# Patient Record
Sex: Male | Born: 1960 | Race: White | Hispanic: No | Marital: Married | Smoking: Never smoker
Health system: Southern US, Community
[De-identification: ages and names within clinical notes are randomized; demographics above are authoritative.]

## PROBLEM LIST (undated history)

## (undated) DIAGNOSIS — E785 Hyperlipidemia, unspecified: Secondary | ICD-10-CM

## (undated) DIAGNOSIS — K5792 Diverticulitis of intestine, part unspecified, without perforation or abscess without bleeding: Secondary | ICD-10-CM

## (undated) HISTORY — DX: Diverticulitis of intestine, part unspecified, without perforation or abscess without bleeding: K57.92

## (undated) HISTORY — DX: Hyperlipidemia, unspecified: E78.5

## (undated) HISTORY — PX: KNEE ARTHROSCOPY: SUR90

## (undated) HISTORY — PX: APPENDECTOMY: SHX54

---

## 2003-02-07 ENCOUNTER — Encounter (INDEPENDENT_AMBULATORY_CARE_PROVIDER_SITE_OTHER): Payer: Self-pay | Admitting: *Deleted

## 2003-02-07 ENCOUNTER — Inpatient Hospital Stay (HOSPITAL_COMMUNITY): Admission: EM | Admit: 2003-02-07 | Discharge: 2003-02-19 | Payer: Self-pay | Admitting: Emergency Medicine

## 2003-02-10 ENCOUNTER — Encounter: Payer: Self-pay | Admitting: General Surgery

## 2003-02-11 ENCOUNTER — Encounter: Payer: Self-pay | Admitting: Surgery

## 2003-02-13 ENCOUNTER — Encounter: Payer: Self-pay | Admitting: General Surgery

## 2003-02-15 ENCOUNTER — Encounter: Payer: Self-pay | Admitting: General Surgery

## 2009-10-16 ENCOUNTER — Encounter: Admission: RE | Admit: 2009-10-16 | Discharge: 2009-10-16 | Payer: Self-pay | Admitting: Internal Medicine

## 2010-09-11 NOTE — Discharge Summary (Signed)
   NAMEMARCELLE, BEBOUT NO.:  [id number]   MEDICAL RECORD NO.:  [id number]                   PATIENT TYPE:  INP   LOCATION:  5734                                 FACILITY:  [facility]   PHYSICIAN:  Jimmye Norman III, M.D.               DATE OF BIRTH:  [birth year]   DATE OF ADMISSION:  02/06/2003  DATE OF DISCHARGE:  02/19/2003                                 DISCHARGE SUMMARY   DISCHARGE DIAGNOSES:  1. Acute appendicitis with rupture.  2. Postoperative ileus.   DISCHARGE MEDICATIONS:  1. Advil.  2. Multivitamins.  3. Tylenol.   DIET:  As tolerated.   ACTIVITY:  He was on a 15 pound weight lifting limit.   FOLLOWUP:  He was to follow up to see me in 7-10 days.   HOSPITAL COURSE:  The patient came in to the emergency room with abdominal  pain and an elevated white count and a CT scan subsequently showed acute  appendicitis with possible rupture.  He was taken to surgery at which time  he was found to have an acutely inflamed and ruptured appendix with an  appendolith over in the right lower quadrant.  He was treated with triple  antibiotics postoperatively and he was afebrile; however, he persisted with  a prolonged ileus which subsequently was investigated with a CT scan to rule  out an intraabdominal or pelvic abscess.  After seven days and no opening  up, CT scan failed to demonstrate an abscess and over the next two to three  days the patient opened up well, was tolerating diet well, and was  discharged home by one of my partners.  He was tolerating a diet well and  doing well.  He was to see me in seven days to two weeks.                                                Kathrin Ruddy, M.D.    JW/MEDQ  D:  02/27/2003  T:  02/28/2003  Job:  [id number]

## 2010-09-11 NOTE — H&P (Signed)
NAMEALROY, PORTELA NO.:  [id number]   MEDICAL RECORD NO.:  [id number]                   PATIENT TYPE:  EMS   LOCATION:  MAJO                                 FACILITY:  [facility]   PHYSICIAN:  Jimmye Norman III, M.D.               DATE OF BIRTH:  1960-10-01   DATE OF ADMISSION:  02/06/2003  DATE OF DISCHARGE:                                HISTORY & PHYSICAL   CHIEF COMPLAINT:  The patient is a 50 year old with acute appendicitis.   HISTORY OF PRESENT ILLNESS:  The patient came into [facility] Emergency Room  at approximately 10:38 p.m. At that time he had abdominal pain, which had  been present for one and one-half to two days. He had recently flown in from  a sales trip. Had some mild nausea, but abdominal pain, which was crampy in  nature and had now started to localize towards the right lower quadrant. He  had no vomiting, some mild nausea, and decreased appetite and came into the  emergency room for evaluation. He reports no previous episodes of this time  and came in for evaluation.   PAST MEDICAL HISTORY:  Unremarkable. He has no history of cardiac, renal,  pulmonary, or liver disease.   PAST SURGICAL HISTORY:  He has had a right knee arthroscopy 10 years ago and  no problems with anesthesia.   MEDICATIONS:  None.   ALLERGIES:  No known drug allergies.   REVIEW OF SYSTEMS:  Unremarkable.   PHYSICAL EXAMINATION:  VITAL SIGNS:  The patient came in afebrile, but  currently has a temperature of 103.3. His pulse is 116, blood pressure  normal.  HEENT:  He is normocephalic and atraumatic. He has no scleral icterus.  NECK:  Supple with no palpable masses. He has no bruits.  CHEST:  Clear to auscultation and percussion.  CARDIAC:  He has tachycardia, but a regular rhythm. No murmurs, gallops,  rubs, or heaves.  ABDOMEN:  Distended with hypoactive bowel sounds. No diffusely tender, but  he has severe tenderness with guarding and rebound in the  right lower  quadrant.  RECTAL:  No gross blood. He has no palpable masses in his pelvis.  GU/RECTAL:  He has no meatal blood and a normal positioned prostate on  rectum.  NEUROLOGIC:  Cranial nerves 2-12 grossly intact.  PSYCHIATRIC:  He is normal.   LABORATORY DATA:  He has a white count of 17,000 with a marked left shift.  He is slightly hemoconcentrated with a hemoglobin of 16. CT scan  demonstrates acute appendicitis.   IMPRESSION:  Acute appendicitis in an otherwise healthy 50 year old male  with delay in diagnosis to some degree because of the need for a CAT scan.  There is a chance that the patient may have perforated. I have explained the  risks and benefits of this procedure to the patient and he wishes to  proceed  accordingly. I have given him time to contact his family and we will go  ahead with an attempted laparoscopic appendectomy. I have told the patient  that it is likely that we may need to open him because of the severity of  the perforation and/or contamination.                                                Kathrin Ruddy, M.D.    JW/MEDQ  D:  02/07/2003  T:  02/07/2003  Job:  [id number]   cc:   Paula Libra, M.D.   Volney American, M.D.

## 2010-09-11 NOTE — Op Note (Signed)
NAMEAADYN, BUCHHEIT NO.:  [id number]   MEDICAL RECORD NO.:  [id number]                   PATIENT TYPE:  INP   LOCATION:  2550                                 FACILITY:  [facility]   PHYSICIAN:  Jimmye Norman III, M.D.               DATE OF BIRTH:  22-Mar-1961   DATE OF PROCEDURE:  02/07/2003  DATE OF DISCHARGE:                                 OPERATIVE REPORT   PREOPERATIVE DIAGNOSIS:  Acute appendicitis with possible rupture.   POSTOPERATIVE DIAGNOSIS:  Ruptured acute appendicitis.   PROCEDURE:  Laparoscopic appendectomy.   SURGEON:  Jimmye Norman, M.D.   ANESTHESIA:  General endotracheal anesthesia.   ESTIMATED BLOOD LOSS:  100 mL.   COMPLICATIONS:  Ruptured appendix.   CONDITION:  Fair.   FINDINGS:  The patient had a ruptured gangrenous appendix with a free  floating appendicolith in the right lower quadrant which was retrieved along  with the resected appendix.  There was no abscess cavity or walled off  abscess area.   INDICATIONS FOR PROCEDURE:  The patient is a 50 year old male otherwise  healthy who comes in with a fever, a white count of 17,000, right lower  quadrant and a CT demonstrating acute appendicitis.   PROCEDURE:  The patient was taken to the operating room and placed on the  table in the supine position. After an adequate general anesthetic was  administered he was prepped and draped in the usual sterile manner exposing  the midline and the right lower quadrant.   We made a supraumbilical incision with the patient in Trendelenburg  position.  We dissected down to the midline fascia through which a Veress  needle was passed into the peritoneal cavity and confirmed to be in position  with the saline test.  Once this was done, carbon dioxide insufflation was  instilled through the Veress into the peritoneal cavity, up to a maximum  intra-abdominal pressure of 15 mmHg.  Once this was done, a right upper  quadrant 5 mm cannula  and a suprapubic 11-12 cannula were passed into the  peritoneal cavity under direct vision.  Once this was done, the patient was  placed in a steeper Trendelenburg position and the left sided was tilted  down.   The acutely inflamed and gangrenous appendix was easily noted in the right  lower quadrant.  It was partially walled off by omentum and also small bowel  loops.  There was one C loop of terminal ileum which was over by the  appendix which had some dense peritoneal attachments which were left in  place.   A free floating appendicolith was noted in the right lower quadrant and it  was grasped with a rigid grasper and then brought out through the suprapubic  site using an EndoCatch bag.  We had to replace the cannula in that area  performing a second site of entrance into  the peritoneal cavity.  Once this  was done, we were able to dissect out the acutely inflamed and gangrenous  appendix down to its base.  We easily saw the base and had an adequate  window around it although it was thinned out and perforated above the area  of the base.  An Endo GIA with 3.5 mm staples was taken across the base of  the appendix and then subsequently a 2.5 mm staple Endo GIA passed across  the vascular pedicle.  There had been bleeding up until that time from the  vascular pedicle and we stapled and clamped below that.   Once we had that in place, we used an EndoCatch bag to bring it out through  the suprapubic site.  We irrigated with four liters of warm saline solution  and no further purulent debris as aspirated.  There was no other indication  of bleeding.  Once we had irrigated adequately we removed all cannulas and  subsequently closed.   The supraumbilical site was closed using a figure-of-eight stitch of 0  Vicryl passed on a UR6 needle.  1/4% Marcaine with epinephrine was injected  at all sites and the skin was closed using a running subcuticular stitch of  5-0 Vicryl.  Sterile dressings  were applied.  All needle counts, sponge  counts and instrument counts were correct.                                               Kathrin Ruddy, M.D.    JW/MEDQ  D:  02/07/2003  T:  02/07/2003  Job:  [id number]

## 2012-05-18 ENCOUNTER — Other Ambulatory Visit: Payer: Self-pay | Admitting: Gastroenterology

## 2012-05-18 DIAGNOSIS — Z1211 Encounter for screening for malignant neoplasm of colon: Secondary | ICD-10-CM

## 2012-06-19 ENCOUNTER — Ambulatory Visit
Admission: RE | Admit: 2012-06-19 | Discharge: 2012-06-19 | Disposition: A | Discharge status: Home / Self Care | Payer: BC Managed Care – PPO | Source: Ambulatory Visit | Attending: Gastroenterology | Admitting: Gastroenterology

## 2012-06-19 DIAGNOSIS — Z1211 Encounter for screening for malignant neoplasm of colon: Secondary | ICD-10-CM

## 2015-08-27 DIAGNOSIS — Z0000 Encounter for general adult medical examination without abnormal findings: Secondary | ICD-10-CM | POA: Diagnosis not present

## 2015-08-27 DIAGNOSIS — Z125 Encounter for screening for malignant neoplasm of prostate: Secondary | ICD-10-CM | POA: Diagnosis not present

## 2015-09-25 DIAGNOSIS — R799 Abnormal finding of blood chemistry, unspecified: Secondary | ICD-10-CM | POA: Diagnosis not present

## 2015-10-07 DIAGNOSIS — H524 Presbyopia: Secondary | ICD-10-CM | POA: Diagnosis not present

## 2015-10-07 DIAGNOSIS — H5203 Hypermetropia, bilateral: Secondary | ICD-10-CM | POA: Diagnosis not present

## 2016-01-27 DIAGNOSIS — E785 Hyperlipidemia, unspecified: Secondary | ICD-10-CM | POA: Diagnosis not present

## 2016-01-28 ENCOUNTER — Telehealth: Payer: Self-pay | Admitting: Cardiology

## 2016-01-28 NOTE — Telephone Encounter (Signed)
[facility] 02/27/16 with Dr Antoine PocheHochrein.  [facility] ([facility] for Dr Hochrein's schedule on 02/27/16. lp

## 2016-02-26 NOTE — Progress Notes (Signed)
Cardiology Office Note   Date:  02/27/2016   ID:  Cameron FortsFloyd Avallone, DOB Sep 03, 1960, MRN [id number]  PCP:  Katy ApoPOLITE,RONALD D, MD  Cardiologist:   Rollene RotundaJames Kareli Hossain, MD  Referring:  Katy ApoPOLITE,RONALD D, MD  Chief Complaint  Patient presents with  . Shortness of Breath      History of Present Illness: Cameron Rich is a 55 y.o. male who presents for risk assessment.  He has cardiovascular risk factors with borderline HTN and dyslipidemia.  He has had no past history.  He is active and rides a bike and exercises otherwise routinely.  The patient denies any new symptoms such as chest discomfort, neck or arm discomfort. There has been no new shortness of breath, PND or orthopnea. There have been no reported palpitations, presyncope or syncope.  He might get some dyspnea at peak exertion.    Past Medical History:  Diagnosis Date  . Diverticulitis   . Hyperlipidemia     Past Surgical History:  Procedure Laterality Date  . APPENDECTOMY    . KNEE ARTHROSCOPY Right      No current outpatient prescriptions on file.   No current facility-administered medications for this visit.     Allergies:   Review of patient's allergies indicates no known allergies.    Social History:  The patient  reports that he has never smoked. He has never used smokeless tobacco.   Family History:  The patient's family history includes Colon cancer in his mother; Diverticulosis in his brother; Hyperlipidemia in his father.    ROS:  Please see the history of present illness.   Otherwise, review of systems are positive for none.   All other systems are reviewed and negative.    PHYSICAL EXAM: VS:  BP 126/84   Pulse 75   Ht 6\' 1"  (1.854 m)   Wt 222 lb (100.7 kg)   BMI 29.29 kg/m  , BMI Body mass index is 29.29 kg/m. GENERAL:  Well appearing HEENT:  Pupils equal round and reactive, fundi not visualized, oral mucosa unremarkable NECK:  No jugular venous distention, waveform within normal limits, carotid upstroke  brisk and symmetric, no bruits, no thyromegaly LYMPHATICS:  No cervical, inguinal adenopathy LUNGS:  Clear to auscultation bilaterally BACK:  No CVA tenderness CHEST:  Unremarkable HEART:  PMI not displaced or sustained,S1 and S2 within normal limits, no S3, no S4, no clicks, no rubs, no murmurs ABD:  Flat, positive bowel sounds normal in frequency in pitch, no bruits, no rebound, no guarding, no midline pulsatile mass, no hepatomegaly, no splenomegaly EXT:  2 plus pulses throughout, no edema, no cyanosis no clubbing SKIN:  No rashes no nodules NEURO:  Cranial nerves II through XII grossly intact, motor grossly intact throughout PSYCH:  Cognitively intact, oriented to person place and time    EKG:  EKG is ordered today. The ekg ordered today demonstrates sinus rhythm, rate 75, axis within normal limits, intervals within normal limits, no acute ST-T wave changes.   Recent Labs: No results found for requested labs within last 8760 hours.    Lipid Panel No results found for: CHOL, TRIG, HDL, CHOLHDL, VLDL, LDLCALC, LDLDIRECT    Wt Readings from Last 3 Encounters:  02/27/16 222 lb (100.7 kg)      Other studies Reviewed: Additional studies/ records that were reviewed today include: None. Review of the above records demonstrates:  Please see elsewhere in the note.     ASSESSMENT AND PLAN:  SOB:  The patient has some mild  dyspnea with exertion and cardiovascular risk factors. However, think the pretest probability of obstructive coronary disease is low. I think it would be reasonable to order a coronary calcium score.  HTN:  Blood pressure is well controlled. Of the meds as listed.  DYSLIPIDEMIA:  Further goals of therapy will be based on presence or absence of coronary calcium   Current medicines are reviewed at length with the patient today.  The patient has no concerns regarding medicines.  The following changes have been made:  no change  Labs/ tests ordered today  include:   Orders Placed This Encounter  Procedures  . CT CARDIAC SCORING  . EKG 12-Lead     Disposition:   FU with me as needed.     Signed, Rollene RotundaJames Yukari Flax, MD  02/27/2016 11:17 AM    [facility]

## 2016-02-27 ENCOUNTER — Encounter: Payer: Self-pay | Admitting: Cardiology

## 2016-02-27 ENCOUNTER — Ambulatory Visit (INDEPENDENT_AMBULATORY_CARE_PROVIDER_SITE_OTHER): Payer: BLUE CROSS/BLUE SHIELD | Admitting: Cardiology

## 2016-02-27 VITALS — BP 126/84 | HR 75 | Ht 73.0 in | Wt 222.0 lb

## 2016-02-27 DIAGNOSIS — R0602 Shortness of breath: Secondary | ICD-10-CM | POA: Diagnosis not present

## 2016-02-27 NOTE — Patient Instructions (Signed)
Medication Instructions:  Continue current medications  Labwork: None Ordered  Testing/Procedures: Your physician has requested that you have a Coronary Calcium Score. This test is done at our [address] [facility]  Follow-Up: Your physician recommends that you schedule a follow-up appointment in: As Needed   Any Other Special Instructions Will Be Listed Below (If Applicable).   If you need a refill on your cardiac medications before your next appointment, please call your pharmacy.

## 2016-03-16 ENCOUNTER — Ambulatory Visit (INDEPENDENT_AMBULATORY_CARE_PROVIDER_SITE_OTHER)
Admission: RE | Admit: 2016-03-16 | Discharge: 2016-03-16 | Disposition: A | Discharge status: Home / Self Care | Payer: Self-pay | Source: Ambulatory Visit | Attending: Cardiology | Admitting: Cardiology

## 2016-03-16 DIAGNOSIS — R0602 Shortness of breath: Secondary | ICD-10-CM

## 2016-05-14 DIAGNOSIS — R509 Fever, unspecified: Secondary | ICD-10-CM | POA: Diagnosis not present

## 2016-05-14 DIAGNOSIS — J069 Acute upper respiratory infection, unspecified: Secondary | ICD-10-CM | POA: Diagnosis not present

## 2016-08-27 DIAGNOSIS — Z125 Encounter for screening for malignant neoplasm of prostate: Secondary | ICD-10-CM | POA: Diagnosis not present

## 2016-08-27 DIAGNOSIS — Z0000 Encounter for general adult medical examination without abnormal findings: Secondary | ICD-10-CM | POA: Diagnosis not present

## 2016-10-12 DIAGNOSIS — H52201 Unspecified astigmatism, right eye: Secondary | ICD-10-CM | POA: Diagnosis not present

## 2016-10-12 DIAGNOSIS — H5203 Hypermetropia, bilateral: Secondary | ICD-10-CM | POA: Diagnosis not present

## 2016-10-12 DIAGNOSIS — H524 Presbyopia: Secondary | ICD-10-CM | POA: Diagnosis not present

## 2017-01-21 DIAGNOSIS — M25572 Pain in left ankle and joints of left foot: Secondary | ICD-10-CM | POA: Diagnosis not present

## 2017-01-24 DIAGNOSIS — M25572 Pain in left ankle and joints of left foot: Secondary | ICD-10-CM | POA: Diagnosis not present

## 2017-05-03 DIAGNOSIS — Z23 Encounter for immunization: Secondary | ICD-10-CM | POA: Diagnosis not present

## 2017-05-13 DIAGNOSIS — R109 Unspecified abdominal pain: Secondary | ICD-10-CM | POA: Diagnosis not present

## 2017-08-31 DIAGNOSIS — Z0000 Encounter for general adult medical examination without abnormal findings: Secondary | ICD-10-CM | POA: Diagnosis not present

## 2017-08-31 DIAGNOSIS — Z125 Encounter for screening for malignant neoplasm of prostate: Secondary | ICD-10-CM | POA: Diagnosis not present

## 2017-10-03 DIAGNOSIS — H6123 Impacted cerumen, bilateral: Secondary | ICD-10-CM | POA: Diagnosis not present

## 2017-11-15 DIAGNOSIS — Z01818 Encounter for other preprocedural examination: Secondary | ICD-10-CM | POA: Diagnosis not present

## 2017-11-15 DIAGNOSIS — Z1211 Encounter for screening for malignant neoplasm of colon: Secondary | ICD-10-CM | POA: Diagnosis not present

## 2017-12-23 DIAGNOSIS — Z800 Family history of malignant neoplasm of digestive organs: Secondary | ICD-10-CM | POA: Diagnosis not present

## 2017-12-23 DIAGNOSIS — Z8371 Family history of colonic polyps: Secondary | ICD-10-CM | POA: Diagnosis not present

## 2017-12-23 DIAGNOSIS — D123 Benign neoplasm of transverse colon: Secondary | ICD-10-CM | POA: Diagnosis not present

## 2017-12-23 DIAGNOSIS — K5730 Diverticulosis of large intestine without perforation or abscess without bleeding: Secondary | ICD-10-CM | POA: Diagnosis not present

## 2017-12-28 DIAGNOSIS — D1801 Hemangioma of skin and subcutaneous tissue: Secondary | ICD-10-CM | POA: Diagnosis not present

## 2017-12-28 DIAGNOSIS — L570 Actinic keratosis: Secondary | ICD-10-CM | POA: Diagnosis not present

## 2017-12-28 DIAGNOSIS — D123 Benign neoplasm of transverse colon: Secondary | ICD-10-CM | POA: Diagnosis not present

## 2017-12-28 DIAGNOSIS — L80 Vitiligo: Secondary | ICD-10-CM | POA: Diagnosis not present

## 2017-12-28 DIAGNOSIS — L821 Other seborrheic keratosis: Secondary | ICD-10-CM | POA: Diagnosis not present

## 2017-12-28 DIAGNOSIS — L918 Other hypertrophic disorders of the skin: Secondary | ICD-10-CM | POA: Diagnosis not present

## 2018-11-03 DIAGNOSIS — E7800 Pure hypercholesterolemia, unspecified: Secondary | ICD-10-CM | POA: Diagnosis not present

## 2018-11-03 DIAGNOSIS — N529 Male erectile dysfunction, unspecified: Secondary | ICD-10-CM | POA: Diagnosis not present

## 2018-11-16 IMAGING — CT CT HEART SCORING
2 series · 16 of 20 positions shown, 18 images · non-contrast
Comparison: CT abdomen 10/16/2009

CLINICAL DATA: Risk stratification

EXAM:
Coronary Calcium Score
TECHNIQUE: The patient was scanned on a Siemens Somatom 64 slice scanner. Axial
non-contrast 3mm slices were carried out through the heart. The data
set was analyzed on a dedicated work station and scored using the
Agatson method.

[Series 2: casc 3.0 i36f 2 bestdiast 66 % · axial · 0.39mm/px · z∈[-227,-131]mm · 8 of 42 slices shown, 10 images]
[im 5/42  vessel]
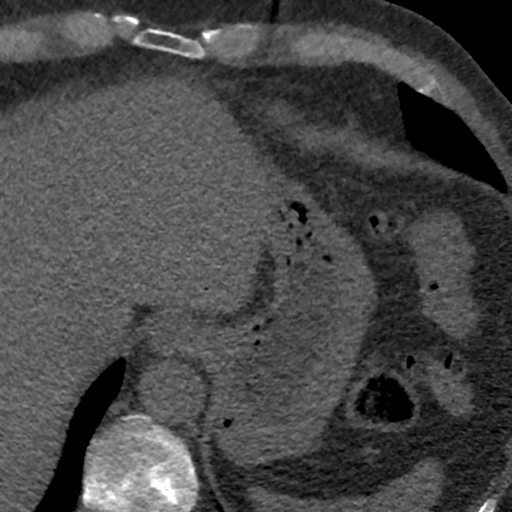
[im 5/42  lung]
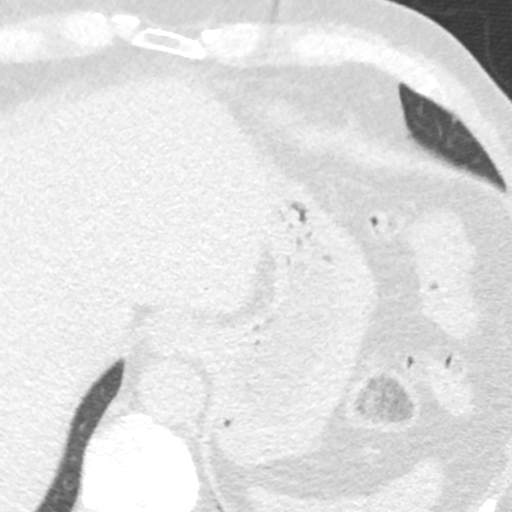
[im 10/42  vessel]
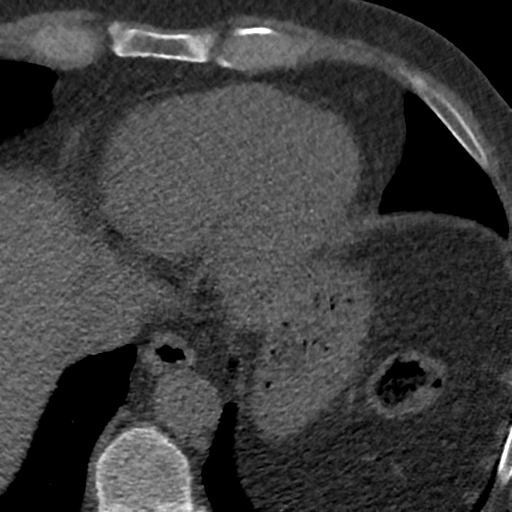
[im 14/42  vessel]
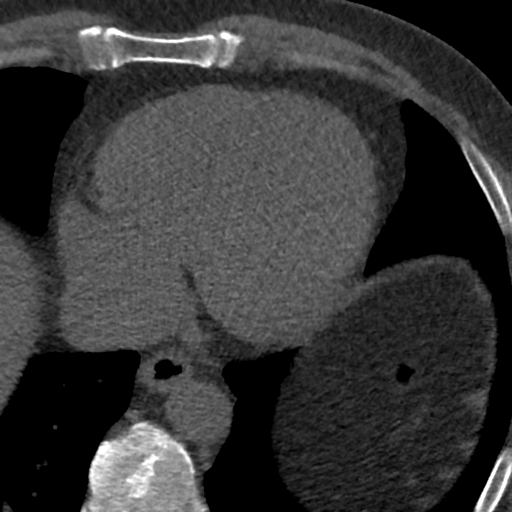
[im 19/42  vessel]
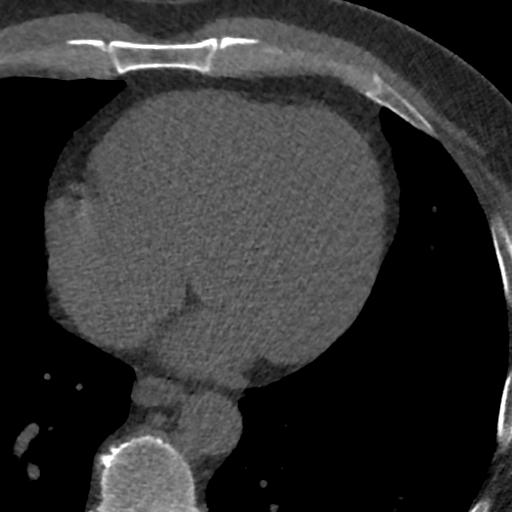
[im 23/42  vessel]
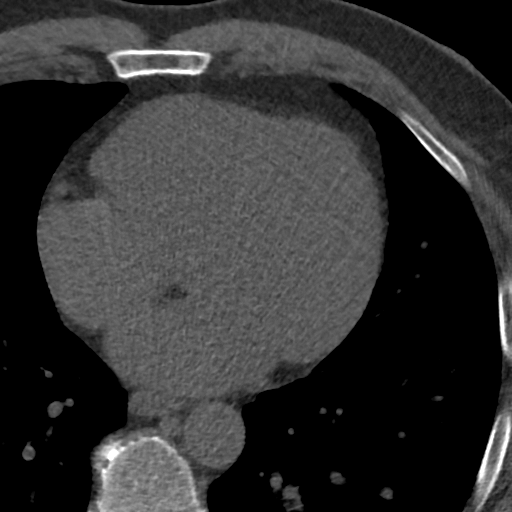
[im 23/42  lung]
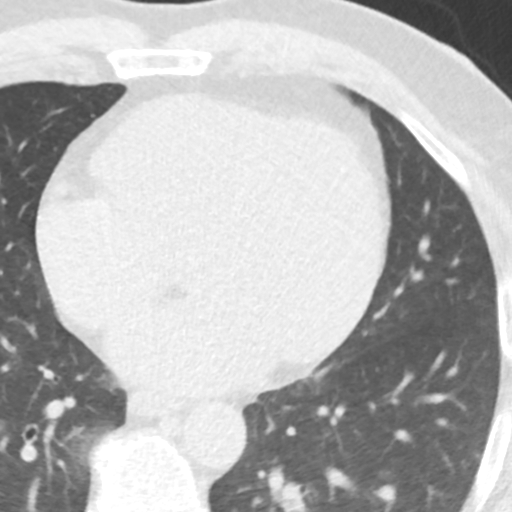
[im 28/42  vessel]
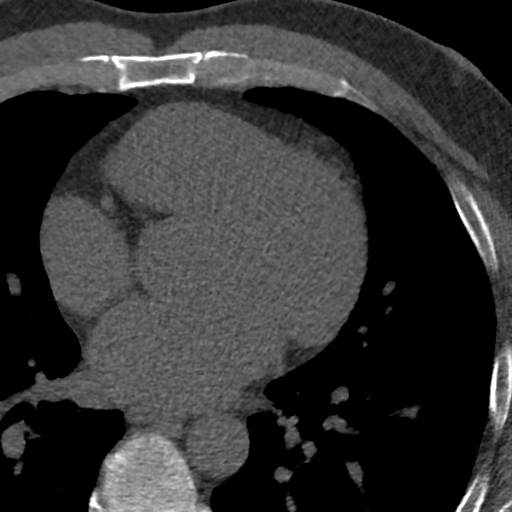
[im 32/42  vessel]
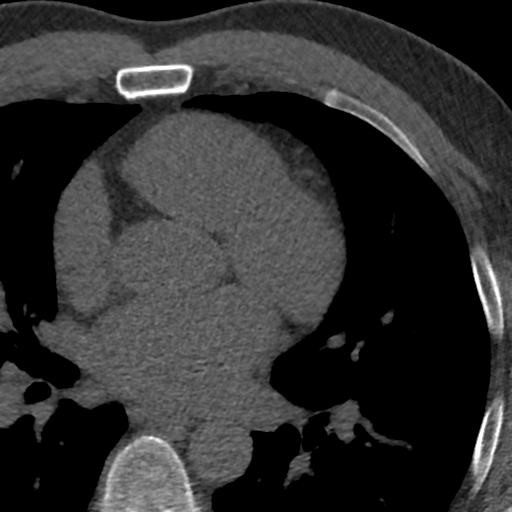
[im 37/42  vessel]
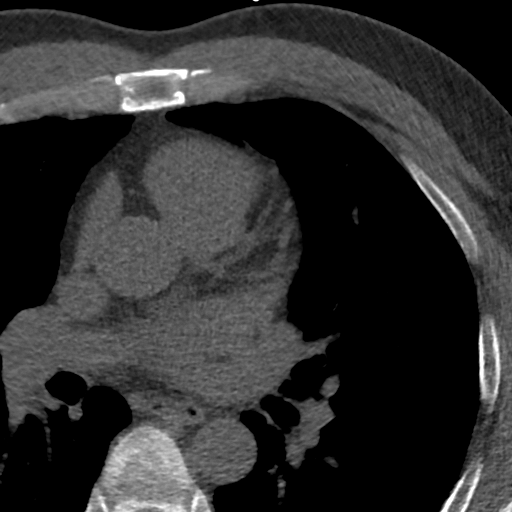

[Series 4: lung st 66 % · axial · 0.69mm/px · z∈[-227,-131]mm · 8 of 42 slices shown]
[im 5/42  lung]
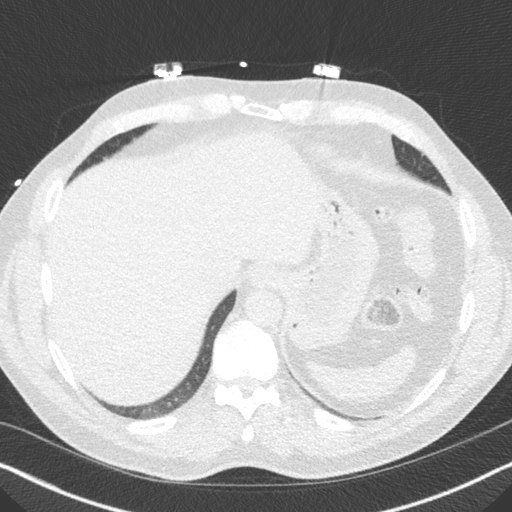
[im 10/42  lung]
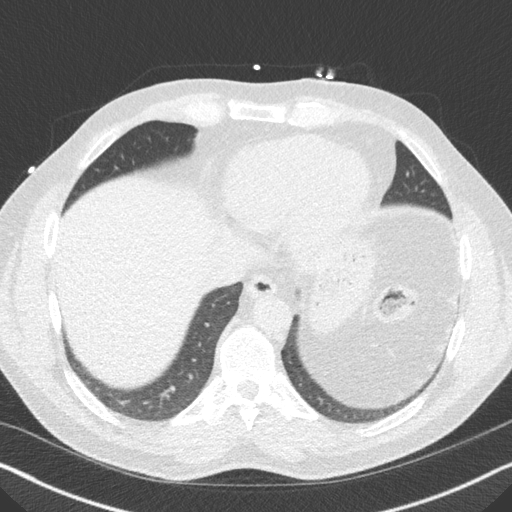
[im 14/42  lung]
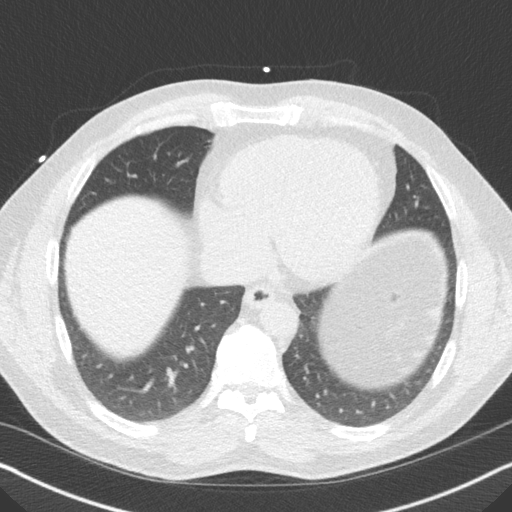
[im 19/42  lung]
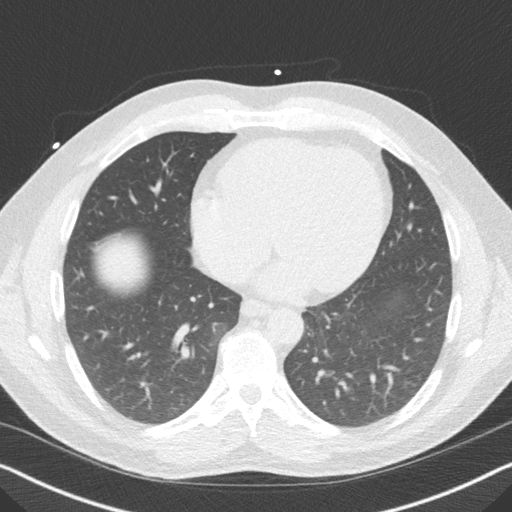
[im 23/42  lung]
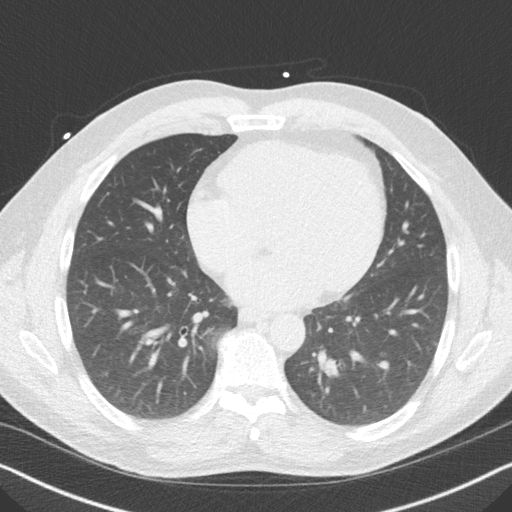
[im 28/42  lung]
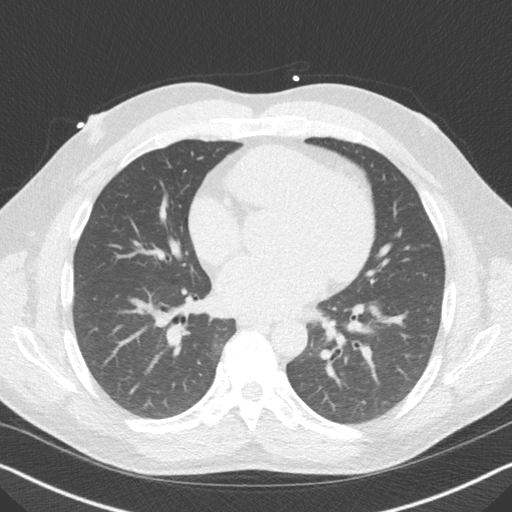
[im 32/42  lung]
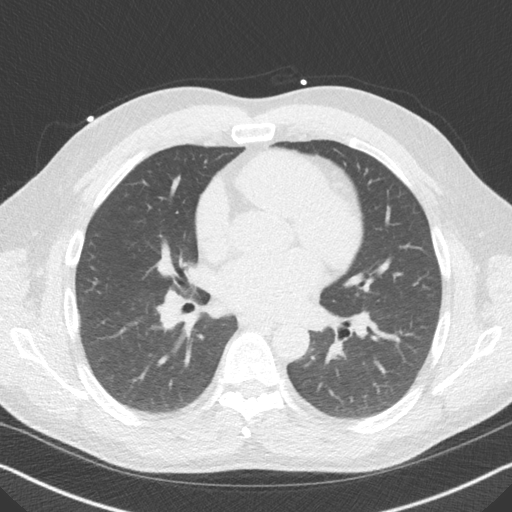
[im 37/42  lung]
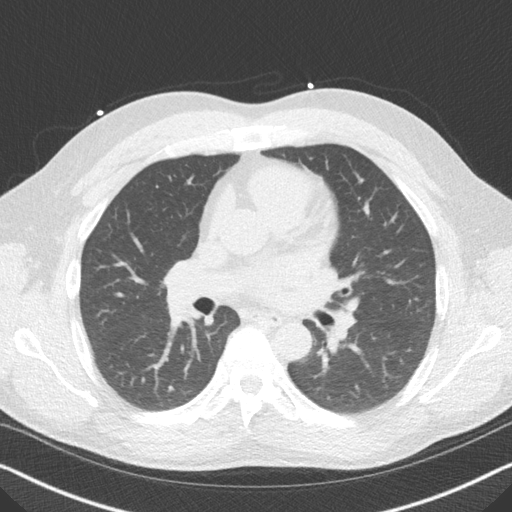

[16 of 20 positions shown; findings below may reference images not displayed]

FINDINGS: Non-cardiac: No significant non cardiac findings on limited lung and
soft tissue windows. See separate report from [REDACTED].

Ascending Aorta:  3.4 cm

Pericardium: Normal

Coronary arteries:  No calcium detected
IMPRESSION: Coronary calcium score of 0.

Shteryo Malaga

EXAM:
OVER-READ INTERPRETATION  CT CHEST

The following report is an over-read performed by radiologist Dr.
does not include interpretation of cardiac or coronary anatomy or
pathology. The coronary calcium score/coronary CTA interpretation by
the cardiologist is attached.
FINDINGS: Visualized lungs are clear. No pleural effusions. Visualized bone
structures are unremarkable. There appears to be a cortical step-off
involving the upper sternum but this is secondary to motion
artifact. No significant lymphadenopathy in the lower mediastinum.
No significant pericardial fluid. Images of the upper abdomen are
unremarkable.
IMPRESSION: Negative over-read exam.

## 2019-01-02 DIAGNOSIS — C44519 Basal cell carcinoma of skin of other part of trunk: Secondary | ICD-10-CM | POA: Diagnosis not present

## 2019-01-02 DIAGNOSIS — C4441 Basal cell carcinoma of skin of scalp and neck: Secondary | ICD-10-CM | POA: Diagnosis not present

## 2019-01-02 DIAGNOSIS — L80 Vitiligo: Secondary | ICD-10-CM | POA: Diagnosis not present

## 2019-01-02 DIAGNOSIS — D1801 Hemangioma of skin and subcutaneous tissue: Secondary | ICD-10-CM | POA: Diagnosis not present

## 2019-01-02 DIAGNOSIS — L821 Other seborrheic keratosis: Secondary | ICD-10-CM | POA: Diagnosis not present

## 2019-01-17 DIAGNOSIS — C44319 Basal cell carcinoma of skin of other parts of face: Secondary | ICD-10-CM | POA: Diagnosis not present

## 2023-08-16 ENCOUNTER — Other Ambulatory Visit: Payer: Self-pay | Admitting: Internal Medicine

## 2023-08-16 ENCOUNTER — Ambulatory Visit
Admission: RE | Admit: 2023-08-16 | Discharge: 2023-08-16 | Disposition: A | Discharge status: Home / Self Care | Source: Ambulatory Visit | Attending: Internal Medicine | Admitting: Internal Medicine

## 2023-08-16 ENCOUNTER — Encounter: Payer: Self-pay | Admitting: Internal Medicine

## 2023-08-16 ENCOUNTER — Encounter: Payer: Self-pay | Admitting: Radiology

## 2023-08-16 DIAGNOSIS — R109 Unspecified abdominal pain: Secondary | ICD-10-CM

## 2023-08-16 MED ORDER — IOPAMIDOL (ISOVUE-300) INJECTION 61%
100.0000 mL | Freq: Once | INTRAVENOUS | Status: AC | PRN
  Administered 2023-08-16: 100 mL via INTRAVENOUS

## 2023-08-17 ENCOUNTER — Other Ambulatory Visit
